# Patient Record
Sex: Male | Born: 1955 | Race: White | Hispanic: No | Marital: Married | Smoking: Never smoker
Health system: Southern US, Community
[De-identification: ages and names within clinical notes are randomized; demographics above are authoritative.]

## PROBLEM LIST (undated history)

## (undated) DIAGNOSIS — G4730 Sleep apnea, unspecified: Secondary | ICD-10-CM

## (undated) HISTORY — PX: INGUINAL HERNIA REPAIR: SUR1180

---

## 1980-08-30 HISTORY — PX: FINGER CLOSED REDUCTION: SHX1633

## 1983-08-31 HISTORY — PX: CLOSED REDUCTION / MANIPULATION JOINT: SUR207

## 1984-05-30 HISTORY — PX: BACK SURGERY: SHX140

## 1986-08-30 HISTORY — PX: HAND SURGERY: SHX662

## 2005-09-24 ENCOUNTER — Ambulatory Visit: Payer: Self-pay | Admitting: Gastroenterology

## 2009-08-30 HISTORY — PX: FOOT SURGERY: SHX648

## 2011-11-30 ENCOUNTER — Ambulatory Visit: Payer: Self-pay | Admitting: Internal Medicine

## 2013-02-26 ENCOUNTER — Ambulatory Visit: Payer: Self-pay | Admitting: Orthopedic Surgery

## 2014-06-13 ENCOUNTER — Ambulatory Visit: Payer: Self-pay | Admitting: Surgery

## 2014-08-30 HISTORY — PX: INGUINAL HERNIA REPAIR: SUR1180

## 2014-09-17 ENCOUNTER — Ambulatory Visit: Payer: Self-pay

## 2014-09-27 ENCOUNTER — Ambulatory Visit: Payer: Self-pay | Admitting: Gastroenterology

## 2014-10-15 ENCOUNTER — Ambulatory Visit: Payer: Self-pay | Admitting: Gastroenterology

## 2014-12-21 NOTE — Op Note (Signed)
PATIENT NAME:  Gregory Cooper, Gregory Cooper MR#:  [id number] DATE OF BIRTH:  03-30-1956  DATE OF PROCEDURE:  06/13/2014  PREOPERATIVE DIAGNOSIS: Umbilical hernia.   POSTOPERATIVE DIAGNOSIS: Umbilical hernia.   PROCEDURE: Umbilical hernia repair.   SURGEON: Renda RollsWilton  Quamel Fitzmaurice, MD.   ANESTHESIA: General.   INDICATIONS: This 59 year old male has had bulging at the umbilicus and associated pain. An umbilical hernia was demonstrated on physical exam and repair was recommended for definitive treatment.   DESCRIPTION OF PROCEDURE: The patient was placed on the operating table in the supine position under general anesthesia. The abdomen was prepared with ChloraPrep and draped in a sterile manner.   An incision was made just above the umbilicus oriented longitudinally approximately 3 cm in length and this was carried down to the central aspect of the umbilicus. The dissection was carried down through the subcutaneous tissues. Several small bleeding points were cauterized.  An umbilical hernia sac was encountered. This was dissected free from surrounding structures and was some 4 cm in length. It was separated from a fascial ring defect and was inverted. Next the properitoneal fat was dissected away from the fascial ring defect. The defect itself was approximately 12 mm in dimension. A Bard soft mesh was cut to create an oval shape of 1.5 x 2 cm, this was placed into the properitoneal plane, oriented transversely, and was sutured to the overlying fascia with through and through 0 Surgilon sutures. Next the repair was carried out with a transversely oriented suture line of interrupted 0 Surgilon figure-of-eight sutures incorporating each suture into the mesh. The repair looked good. Hemostasis was intact. The deep fascia and subcutaneous tissues were infiltrated with 0.5% Sensorcaine with epinephrine.  The subcutaneous tissues were closed with a 5-0 Monocryl pursestring suture. Next the skin was closed with running 5-0 Monocryl  subcuticular suture and Dermabond. The patient tolerated surgery satisfactorily and was then prepared for transfer to the recovery room.     ____________________________ Shela CommonsJ. Renda RollsWilton Bush Murdoch, MD jws:bu D: 06/13/2014 08:36:30 ET T: 06/13/2014 13:21:46 ET JOB#: [id number]  cc: Adella HareJ. Wilton Ryenne Lynam, MD, <Dictator> Adella HareWILTON J Naryah Clenney MD ELECTRONICALLY SIGNED 06/14/2014 17:52

## 2014-12-23 LAB — SURGICAL PATHOLOGY

## 2015-04-09 ENCOUNTER — Encounter
Admission: RE | Admit: 2015-04-09 | Discharge: 2015-04-09 | Disposition: A | Discharge status: Home / Self Care | Payer: BC Managed Care – PPO | Source: Ambulatory Visit | Attending: Orthopedic Surgery | Admitting: Orthopedic Surgery

## 2015-04-09 DIAGNOSIS — Z01812 Encounter for preprocedural laboratory examination: Secondary | ICD-10-CM | POA: Diagnosis present

## 2015-04-09 HISTORY — DX: Sleep apnea, unspecified: G47.30

## 2015-04-09 LAB — CBC
HCT: 38.7 % — ABNORMAL LOW (ref 40.0–52.0)
Hemoglobin: 12.2 g/dL — ABNORMAL LOW (ref 13.0–18.0)
MCH: 20.2 pg — AB (ref 26.0–34.0)
MCHC: 31.6 g/dL — AB (ref 32.0–36.0)
MCV: 63.8 fL — AB (ref 80.0–100.0)
PLATELETS: 164 10*3/uL (ref 150–440)
RBC: 6.06 MIL/uL — ABNORMAL HIGH (ref 4.40–5.90)
RDW: 16.8 % — ABNORMAL HIGH (ref 11.5–14.5)
WBC: 6.9 10*3/uL (ref 3.8–10.6)

## 2015-04-09 LAB — BASIC METABOLIC PANEL
ANION GAP: 9 (ref 5–15)
BUN: 11 mg/dL (ref 6–20)
CO2: 27 mmol/L (ref 22–32)
Calcium: 9.1 mg/dL (ref 8.9–10.3)
Chloride: 103 mmol/L (ref 101–111)
Creatinine, Ser: 0.86 mg/dL (ref 0.61–1.24)
Glucose, Bld: 108 mg/dL — ABNORMAL HIGH (ref 65–99)
Potassium: 4.1 mmol/L (ref 3.5–5.1)
SODIUM: 139 mmol/L (ref 135–145)

## 2015-04-09 LAB — URINALYSIS COMPLETE WITH MICROSCOPIC (ARMC ONLY)
BILIRUBIN URINE: NEGATIVE
Glucose, UA: NEGATIVE mg/dL
Hgb urine dipstick: NEGATIVE
KETONES UR: NEGATIVE mg/dL
Leukocytes, UA: NEGATIVE
Nitrite: NEGATIVE
Protein, ur: NEGATIVE mg/dL
RBC / HPF: NONE SEEN RBC/hpf (ref 0–5)
Specific Gravity, Urine: 1.021 (ref 1.005–1.030)
Squamous Epithelial / LPF: NONE SEEN
pH: 5 (ref 5.0–8.0)

## 2015-04-09 LAB — ABO/RH: ABO/RH(D): O POS

## 2015-04-09 LAB — SURGICAL PCR SCREEN
MRSA, PCR: NEGATIVE
STAPHYLOCOCCUS AUREUS: POSITIVE — AB

## 2015-04-09 LAB — TYPE AND SCREEN
ABO/RH(D): O POS
Antibody Screen: NEGATIVE

## 2015-04-09 LAB — PROTIME-INR
INR: 1.06
PROTHROMBIN TIME: 14 s (ref 11.4–15.0)

## 2015-04-09 LAB — SEDIMENTATION RATE: SED RATE: 4 mm/h (ref 0–20)

## 2015-04-09 LAB — APTT: aPTT: 26 seconds (ref 24–36)

## 2015-04-09 NOTE — Patient Instructions (Addendum)
  Your procedure is scheduled on:Wednesday April 23, 2015 Report to Same Day Surgery. To find out your arrival time please call [phone] between 1PM - 3PM on Tuesday April 22, 2015.  Remember: Instructions that are not followed completely may result in serious medical risk, up to and including death, or upon the discretion of your surgeon and anesthesiologist your surgery may need to be rescheduled.    __x__ 1. Do not eat food or drink liquids after midnight. No gum chewing or hard candies.     ____ 2. No Alcohol for 24 hours before or after surgery.   ____ 3. Bring all medications with you on the day of surgery if instructed.    __x__ 4. Notify your doctor if there is any change in your medical condition     (cold, fever, infections).     Do not wear jewelry, make-up, hairpins, clips or nail polish.  Do not wear lotions, powders, or perfumes. You may wear deodorant.  Do not shave 48 hours prior to surgery. Men may shave face and neck.  Do not bring valuables to the hospital.    [facility] is not responsible for any belongings or valuables.               Contacts, dentures or bridgework may not be worn into surgery.  Leave your suitcase in the car. After surgery it may be brought to your room.  For patients admitted to the hospital, discharge time is determined by your treatment team.   Patients discharged the day of surgery will not be allowed to drive home.    Please read over the following fact sheets that you were given:   [facility] Preparing for Surgery  ____ Take these medicines the morning of surgery with A SIP OF WATER: NONE    ____ Fleet Enema (as directed)   __x__ Use CHG Soap as directed  ____ Use inhalers on the day of surgery  ____ Stop metformin 2 days prior to surgery    ____ Take 1/2 of usual insulin dose the night before surgery and none on the morning of surgery.   _x___ Stop aspirin on April 16, 2015  __x__ Stop Anti-inflammatories(Aleve,  ibuprofen etc) on April 16, 2015. OK to take Tylenol.   ____ Stop supplements until after surgery.    ____ Bring C-Pap to the hospital.

## 2015-04-11 LAB — URINE CULTURE: Culture: NO GROWTH

## 2015-04-23 ENCOUNTER — Encounter: Admission: RE | Payer: Self-pay | Source: Ambulatory Visit

## 2015-04-23 ENCOUNTER — Inpatient Hospital Stay
Admission: RE | Admit: 2015-04-23 | Payer: BC Managed Care – PPO | Source: Ambulatory Visit | Admitting: Orthopedic Surgery

## 2015-04-23 SURGERY — ARTHROPLASTY, KNEE, TOTAL
Anesthesia: Choice | Laterality: Left

## 2015-05-20 ENCOUNTER — Encounter: Payer: Self-pay | Admitting: *Deleted

## 2015-05-20 ENCOUNTER — Inpatient Hospital Stay: Admission: RE | Admit: 2015-05-20 | Payer: BC Managed Care – PPO | Source: Ambulatory Visit

## 2015-05-20 NOTE — Pre-Procedure Instructions (Signed)
CALLED ELAINE AT DR HOOTENS OFFICE ABOUT PTS ORDERS-PT WAS CANCELLED IN AUGUST DUE TO [employer] AND THEY RESCHEDULED PT TO BE A PHONE INTERVIEW TO REVIEW MEDS WITH PT-THE ORDERS THAT WERE SENT OVER WERE THE OLD ORDERS THAT HAD ALL OF HIS PREVIOUS LABS THAT WERE ALL DRAWN IN AUGUST-PT IS COMING IN ON Thursday FOR T&S AND WAS WANDERING WHAT LABS IF ANY DR HOOTEN WANTED SINCE ORDERS WERE THE SAME AS BEFORE. ELAINE SAID SHE WOULD CHECK WITH DR Ernest Pine AND LET ME KNOW

## 2015-05-20 NOTE — Patient Instructions (Signed)
  Your procedure is scheduled on: 05-26-15 Report to [facility] 2ND FLOOR To find out your arrival time please call [phone] between 1PM - 3PM on 05-23-15 (FRIDAY)  Remember: Instructions that are not followed completely may result in serious medical risk, up to and including death, or upon the discretion of your surgeon and anesthesiologist your surgery may need to be rescheduled.    __X__ 1. Do not eat food or drink liquids after midnight. No gum chewing or hard candies.     __X__ 2. No Alcohol for 24 hours before or after surgery.   ____ 3. Bring all medications with you on the day of surgery if instructed.    __X__ 4. Notify your doctor if there is any change in your medical condition     (cold, fever, infections).     Do not wear jewelry, make-up, hairpins, clips or nail polish.  Do not wear lotions, powders, or perfumes. You may wear deodorant.  Do not shave 48 hours prior to surgery. Men may shave face and neck.  Do not bring valuables to the hospital.    [facility] is not responsible for any belongings or valuables.               Contacts, dentures or bridgework may not be worn into surgery.  Leave your suitcase in the car. After surgery it may be brought to your room.  For patients admitted to the hospital, discharge time is determined by your  treatment team.   Patients discharged the day of surgery will not be allowed to drive home.   Please read over the following fact sheets that you were given:      ____ Take these medicines the morning of surgery with A SIP OF WATER:    1. NONE  2.   3.   4.  5.  6.  ____ Fleet Enema (as directed)   __X__ Use CHG Soap as directed  ____ Use inhalers on the day of surgery  ____ Stop metformin 2 days prior to surgery    ____ Take 1/2 of usual insulin dose the night before surgery and none on the morning of surgery.   ____ Stop Coumadin/Plavix/aspirin-N/A  ____ Stop Anti-inflammatories-NO NSAIDS OR  ASPIRIN PRODUCTS-TYLENOL OK   ____ Stop supplements until after surgery.    __X__ Bring C-Pap to the hospital.

## 2015-05-22 ENCOUNTER — Encounter
Admission: RE | Admit: 2015-05-22 | Discharge: 2015-05-22 | Disposition: A | Discharge status: Home / Self Care | Payer: BC Managed Care – PPO | Source: Ambulatory Visit | Attending: Orthopedic Surgery | Admitting: Orthopedic Surgery

## 2015-05-22 DIAGNOSIS — Z01812 Encounter for preprocedural laboratory examination: Secondary | ICD-10-CM | POA: Insufficient documentation

## 2015-05-22 LAB — TYPE AND SCREEN
ABO/RH(D): O POS
ANTIBODY SCREEN: NEGATIVE

## 2015-05-22 LAB — SURGICAL PCR SCREEN
MRSA, PCR: NEGATIVE
STAPHYLOCOCCUS AUREUS: NEGATIVE

## 2015-05-22 NOTE — Pre-Procedure Instructions (Signed)
ELAINE FROM DR HOOTENS OFFICE CALLED BACK AND LEFT ME A MESSAGE THAT STATED THAT DR HOOTEN ONLY WANTS T&S DONE-NO OTHER LABS NEED TO BE REPEATED PER ELAINE PRIOR TO RESCHEDULED TOTAL KNEE ARTHROPLASTY

## 2015-05-26 ENCOUNTER — Encounter: Admission: RE | Payer: Self-pay | Source: Ambulatory Visit

## 2015-05-26 ENCOUNTER — Inpatient Hospital Stay
Admission: RE | Admit: 2015-05-26 | Payer: BC Managed Care – PPO | Source: Ambulatory Visit | Admitting: Orthopedic Surgery

## 2015-05-26 SURGERY — ARTHROPLASTY, KNEE, TOTAL
Anesthesia: Choice | Laterality: Left

## 2015-05-26 MED ORDER — FAMOTIDINE 20 MG PO TABS: 20.0000 mg | ORAL_TABLET | Freq: Once | ORAL | Status: DC

## 2015-05-26 MED ORDER — TRANEXAMIC ACID 1000 MG/10ML IV SOLN
1500.0000 mg | INTRAVENOUS | Status: DC
  Filled 2015-05-26: qty 15

## 2015-05-26 MED ORDER — LACTATED RINGERS IV SOLN: INTRAVENOUS | Status: DC

## 2015-05-26 MED ORDER — CLINDAMYCIN PHOSPHATE 900 MG/50ML IV SOLN: 900.0000 mg | Freq: Once | INTRAVENOUS | Status: DC

## 2015-05-26 NOTE — OR Nursing (Deleted)
Vascular lab and [facility] notified pt has not arrived yet.

## 2015-07-10 ENCOUNTER — Encounter
Admission: RE | Admit: 2015-07-10 | Discharge: 2015-07-10 | Disposition: A | Discharge status: Home / Self Care | Payer: BC Managed Care – PPO | Source: Ambulatory Visit | Attending: Orthopedic Surgery | Admitting: Orthopedic Surgery

## 2015-07-10 DIAGNOSIS — Z01812 Encounter for preprocedural laboratory examination: Secondary | ICD-10-CM | POA: Diagnosis present

## 2015-07-10 LAB — PROTIME-INR
INR: 1.06
Prothrombin Time: 14 seconds (ref 11.4–15.0)

## 2015-07-10 LAB — CBC
HCT: 38.7 % — ABNORMAL LOW (ref 40.0–52.0)
Hemoglobin: 11.9 g/dL — ABNORMAL LOW (ref 13.0–18.0)
MCH: 19.6 pg — ABNORMAL LOW (ref 26.0–34.0)
MCHC: 30.8 g/dL — ABNORMAL LOW (ref 32.0–36.0)
MCV: 63.7 fL — ABNORMAL LOW (ref 80.0–100.0)
PLATELETS: 174 10*3/uL (ref 150–440)
RBC: 6.08 MIL/uL — AB (ref 4.40–5.90)
RDW: 16.7 % — AB (ref 11.5–14.5)
WBC: 6.1 10*3/uL (ref 3.8–10.6)

## 2015-07-10 LAB — SURGICAL PCR SCREEN
MRSA, PCR: NEGATIVE
STAPHYLOCOCCUS AUREUS: NEGATIVE

## 2015-07-10 LAB — URINALYSIS COMPLETE WITH MICROSCOPIC (ARMC ONLY)
Bacteria, UA: NONE SEEN
Bilirubin Urine: NEGATIVE
GLUCOSE, UA: NEGATIVE mg/dL
Hgb urine dipstick: NEGATIVE
Ketones, ur: NEGATIVE mg/dL
LEUKOCYTES UA: NEGATIVE
Nitrite: NEGATIVE
Protein, ur: NEGATIVE mg/dL
RBC / HPF: NONE SEEN RBC/hpf (ref 0–5)
SPECIFIC GRAVITY, URINE: 1.027 (ref 1.005–1.030)
SQUAMOUS EPITHELIAL / LPF: NONE SEEN
pH: 5 (ref 5.0–8.0)

## 2015-07-10 LAB — BASIC METABOLIC PANEL
ANION GAP: 6 (ref 5–15)
BUN: 10 mg/dL (ref 6–20)
CALCIUM: 8.6 mg/dL — AB (ref 8.9–10.3)
CO2: 25 mmol/L (ref 22–32)
Chloride: 108 mmol/L (ref 101–111)
Creatinine, Ser: 0.96 mg/dL (ref 0.61–1.24)
GFR calc Af Amer: >60 mL/min (ref >60)
GFR calc non Af Amer: >60 mL/min (ref >60)
Glucose, Bld: 109 mg/dL — ABNORMAL HIGH (ref 65–99)
POTASSIUM: 4 mmol/L (ref 3.5–5.1)
Sodium: 139 mmol/L (ref 135–145)

## 2015-07-10 LAB — TYPE AND SCREEN
ABO/RH(D): O POS
ANTIBODY SCREEN: NEGATIVE

## 2015-07-10 LAB — APTT: APTT: 28 s (ref 24–36)

## 2015-07-10 LAB — SEDIMENTATION RATE: SED RATE: 5 mm/h (ref 0–20)

## 2015-07-10 NOTE — Patient Instructions (Signed)
  Your procedure is scheduled on: Wednesday Nov. 23, 2016. Report to Same Day Surgery. To find out your arrival time please call [phone] between 1PM - 3PM on Tuesday Nov. 22, 2016 .  Remember: Instructions that are not followed completely may result in serious medical risk, up to and including death, or upon the discretion of your surgeon and anesthesiologist your surgery may need to be rescheduled.    _x___ 1. Do not eat food or drink liquids after midnight. No gum chewing or hard candies.     _x__ 2. No Alcohol for 24 hours before or after surgery.   ____ 3. Bring all medications with you on the day of surgery if instructed.    __x__ 4. Notify your doctor if there is any change in your medical condition     (cold, fever, infections).     Do not wear jewelry, make-up, hairpins, clips or nail polish.  Do not wear lotions, powders, or perfumes. You may wear deodorant.  Do not shave 48 hours prior to surgery. Men may shave face and neck.  Do not bring valuables to the hospital.    [facility] or [facility]uables.               Contacts, dentures or bridgework may not be worn into surgery.  Leave your suitcase in the car. After surgery it may be brought to your room.  For patients admitted to the hospital, discharge time is determined by your treatment team.   Patients discharged the day of surgery will not be allowed to drive home.    Please read over the following fact sheets that you were given:   [facility]  ____ Take these medicines the morning of surgery with A SIP OF WATER: NONE    ____ Fleet Enema (as directed)   __x__ Use CHG Soap as directed  ____ Use inhalers on the day of surgery  ____ Stop metformin 2 days prior to surgery    ____ Take 1/2 of usual insulin dose the night before surgery and none on the morning of surgery.   _x___ Stop Aspirin on 7 days prior to surgery.  ____ Stop Anti-inflammatories on  7 days prior to surgery.  See Dr. Elenor LegatoHooten's list of medications to stop before surgery.   ____ Stop supplements until after surgery.    ____ Bring C-Pap to the hospital.

## 2015-07-12 LAB — URINE CULTURE
CULTURE: NO GROWTH
SPECIAL REQUESTS: NORMAL

## 2015-07-23 ENCOUNTER — Encounter: Payer: Self-pay | Admitting: Certified Registered Nurse Anesthetist

## 2015-07-23 ENCOUNTER — Inpatient Hospital Stay
Admission: RE | Admit: 2015-07-23 | Payer: BC Managed Care – PPO | Source: Ambulatory Visit | Admitting: Orthopedic Surgery

## 2015-07-23 ENCOUNTER — Encounter: Admission: RE | Payer: Self-pay | Source: Ambulatory Visit

## 2015-07-23 SURGERY — ARTHROPLASTY, KNEE, TOTAL, USING IMAGELESS COMPUTER-ASSISTED NAVIGATION
Anesthesia: Choice | Laterality: Left

## 2015-07-23 MED ORDER — ACETAMINOPHEN 10 MG/ML IV SOLN
INTRAVENOUS | Status: AC
  Filled 2015-07-23: qty 100

## 2015-07-23 MED ORDER — BUPIVACAINE LIPOSOME 1.3 % IJ SUSP
INTRAMUSCULAR | Status: AC
  Filled 2015-07-23: qty 20

## 2015-07-23 MED ORDER — BUPIVACAINE-EPINEPHRINE (PF) 0.25% -1:200000 IJ SOLN
INTRAMUSCULAR | Status: AC
  Filled 2015-07-23: qty 30

## 2015-07-23 MED ORDER — FAMOTIDINE 20 MG PO TABS: 20.0000 mg | ORAL_TABLET | Freq: Once | ORAL | Status: DC

## 2015-07-23 MED ORDER — CLINDAMYCIN PHOSPHATE 900 MG/50ML IV SOLN: 900.0000 mg | Freq: Once | INTRAVENOUS | Status: DC

## 2015-07-23 MED ORDER — TRANEXAMIC ACID 1000 MG/10ML IV SOLN
1500.0000 mg | INTRAVENOUS | Status: DC
  Filled 2015-07-23: qty 15

## 2015-07-23 MED ORDER — SODIUM CHLORIDE 0.9 % IJ SOLN
INTRAMUSCULAR | Status: AC
  Filled 2015-07-23: qty 50

## 2015-07-23 MED ORDER — NEOMYCIN-POLYMYXIN B GU 40-200000 IR SOLN
Status: AC
  Filled 2015-07-23: qty 14

## 2015-07-23 MED ORDER — LACTATED RINGERS IV SOLN: INTRAVENOUS | Status: DC

## 2016-12-25 IMAGING — CT CT ABD-PELV W/ CM
2 of 5 series · 16 of 46 positions shown, 18 images · IV contrast (omnipaque)
Comparison: None.

CLINICAL DATA: Left flank pain for 1 month. History of hernia
repair with mesh.

EXAM:
CT ABDOMEN AND PELVIS WITH CONTRAST
TECHNIQUE: Multidetector CT imaging of the abdomen and pelvis was performed
using the standard protocol following bolus administration of
intravenous contrast.
CONTRAST:  100 mL Omnipaque

[Series 2: routine abd pel with · axial · 0.85mm/px · z∈[-1130,-704]mm · 13 of 97 slices shown, 15 images]
[im 6/97  soft-tissue]
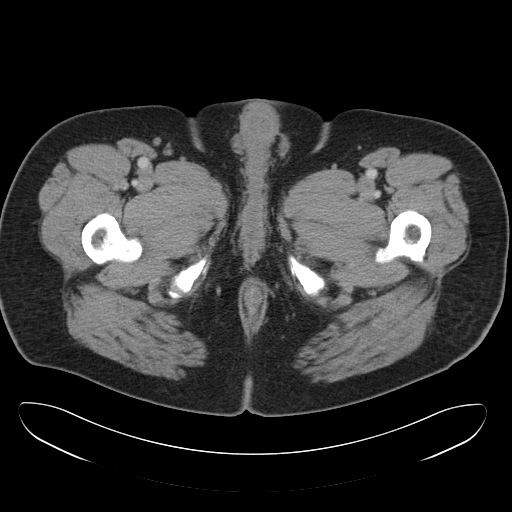
[im 6/97  bone]
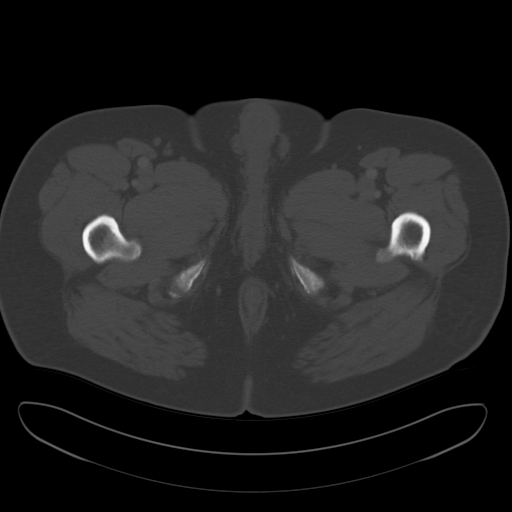
[im 16/97  soft-tissue]
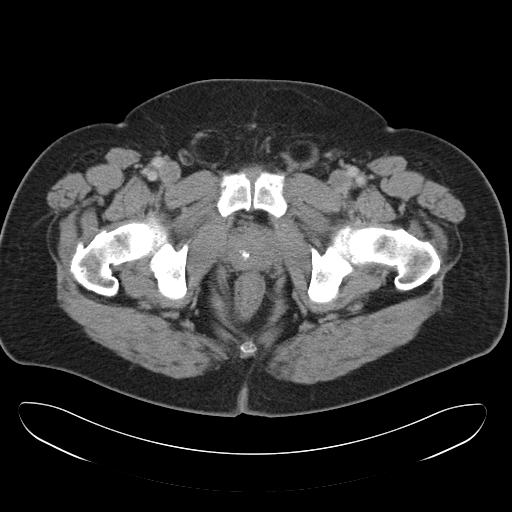
[im 21/97  soft-tissue]
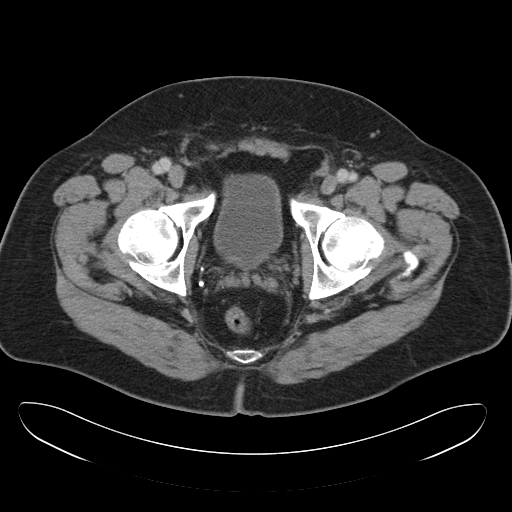
[im 26/97  soft-tissue]
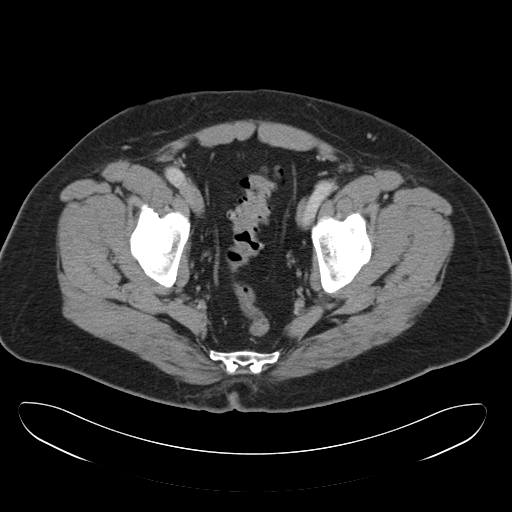
[im 36/97  soft-tissue]
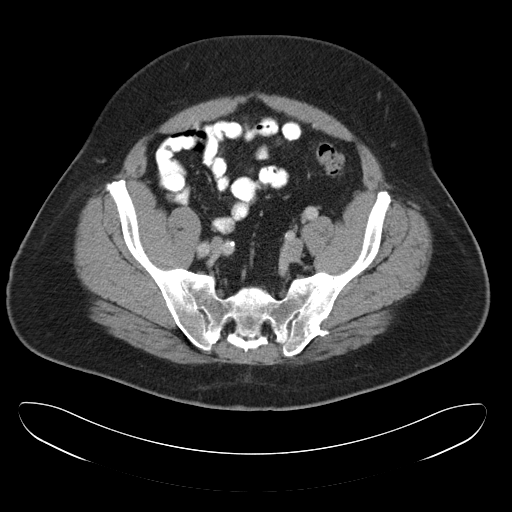
[im 41/97  soft-tissue]
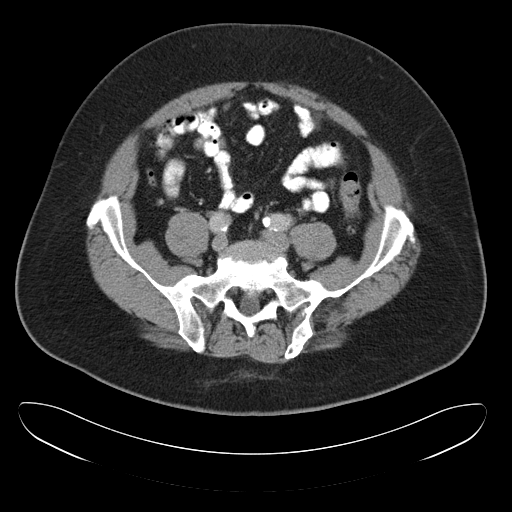
[im 51/97  soft-tissue]
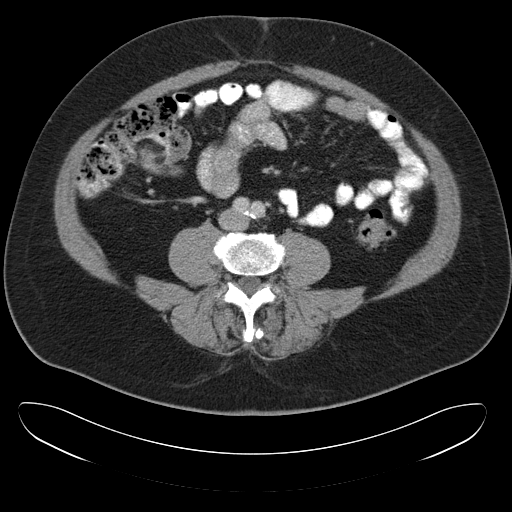
[im 56/97  soft-tissue]
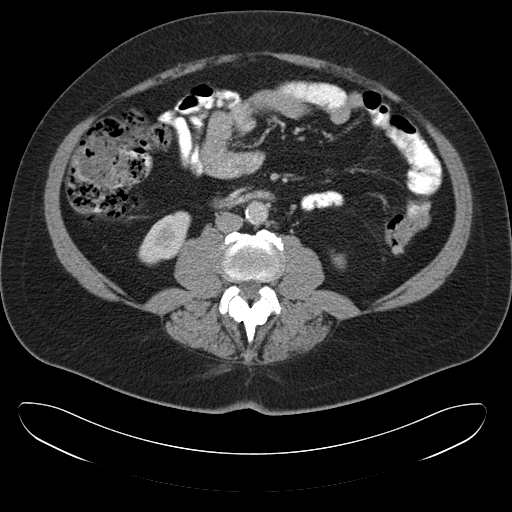
[im 61/97  soft-tissue]
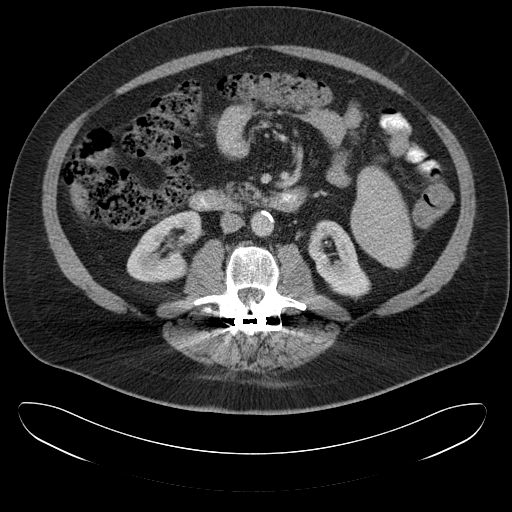
[im 61/97  bone]
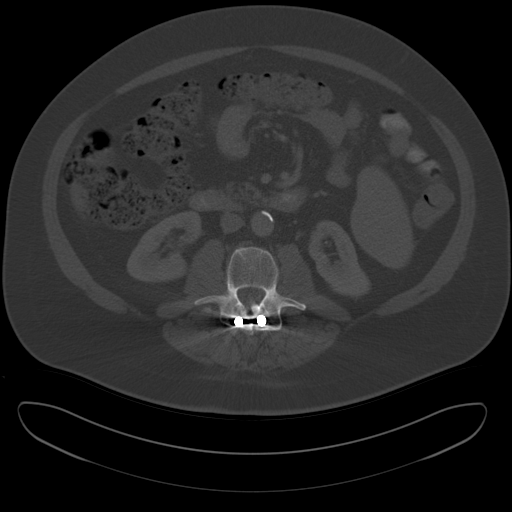
[im 71/97  soft-tissue]
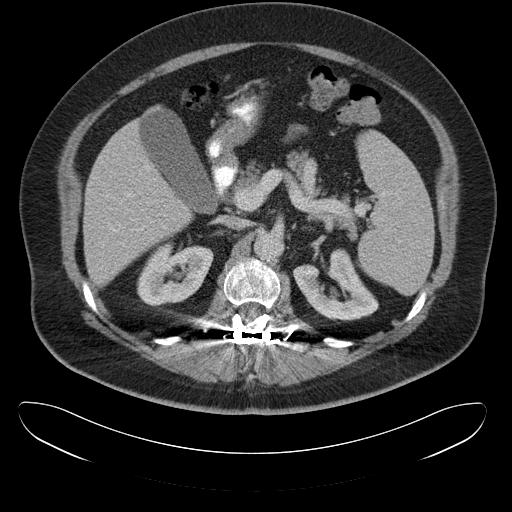
[im 76/97  soft-tissue]
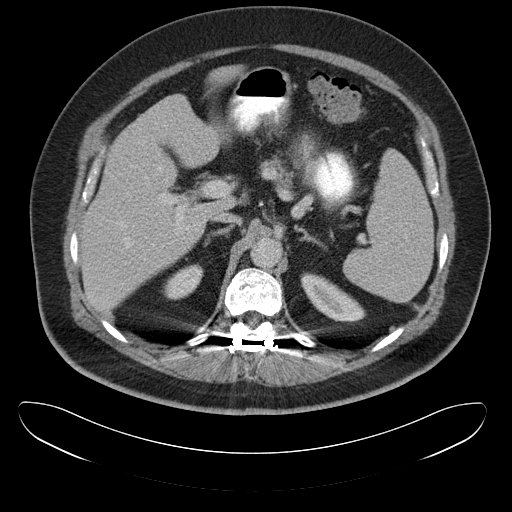
[im 81/97  soft-tissue]
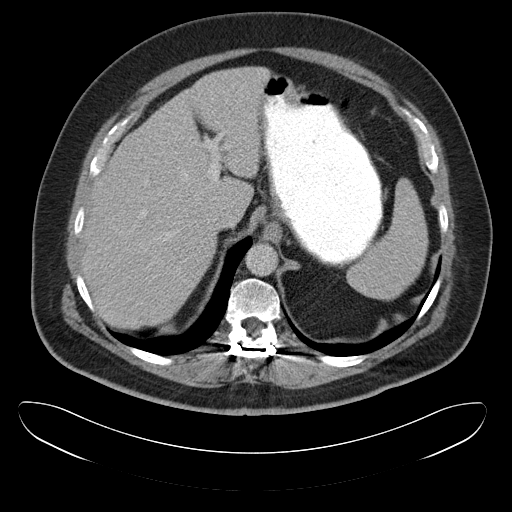
[im 91/97  soft-tissue]
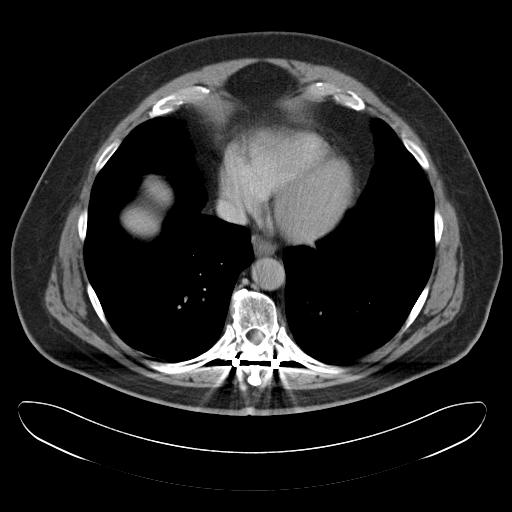

[Series 6: cor routine abd pel with · coronal · 0.84mm/px · 3 of 160 slices shown]
[im 54/160  soft-tissue]
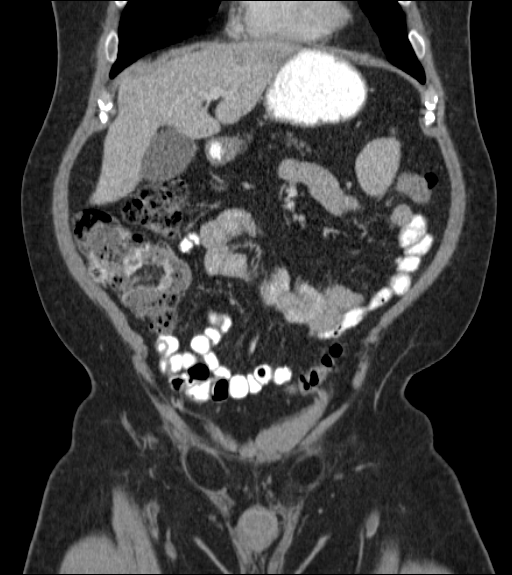
[im 71/160  soft-tissue]
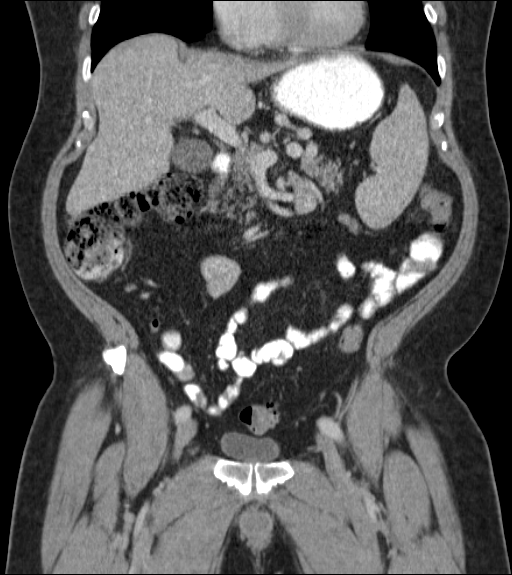
[im 89/160  soft-tissue]
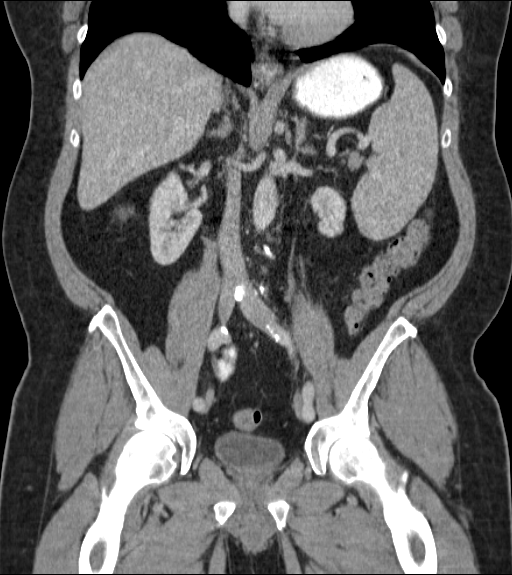

[16 of 46 positions shown; findings below may reference images not displayed]

FINDINGS: Lower chest:  Lung bases are clear.

Hepatobiliary: No focal hepatic lesion. No biliary duct dilatation.
Gallbladder is normal. Common bile duct is normal.

Pancreas: Pancreas is normal. No ductal dilatation. No pancreatic
inflammation.

Spleen: Normal spleen.

Adrenals/urinary tract: Adrenal glands are normal. Kidneys, ureters,
bladder normal. Nonobstructing calculus lower pole of the left
kidney.

Stomach/Bowel: Stomach, small bowel, appendix, cecum are normal. The
colon has diverticula through the descending colon sigmoid colon
without acute inflammation. The rectum is normal. There is a short
small bowel intussusceptions in the left lower quadrant measuring 3
cm (image 52, series 2). No mass lesion evident. No inflammation. No
obstruction.

Vascular/Lymphatic: Abdominal aorta is normal caliber. There is no
retroperitoneal or periportal lymphadenopathy. No pelvic
lymphadenopathy.

Reproductive: Prostate gland is normal. No pelvic lymphadenopathy.
There are bilateral fat filled inguinal hernias without
inflammation.

Musculoskeletal: Posterior spinal fusion noted. No aggressive
osseous lesion.

Other: No free fluid the abdomen or pelvis.
IMPRESSION: 1. Sigmoid diverticulosis without evidence of diverticulitis.
2. Bilateral fat filled inguinal hernias.
3. Short segment small bowel intussusception. Intussusceptions of
this length (less than 3.5 cm) are typically self-limiting.
Recommend follow-up exam if patient has persistent left lower
quadrant pain.

## 2017-01-04 IMAGING — CR DG SMALL BOWEL
1 series · 4 of 4 positions shown · non-contrast
Comparison: None.

CLINICAL DATA: Abdominal pain.

EXAM:
SMALL BOWEL SERIES
TECHNIQUE: Following ingestion of thin barium, serial small bowel images were
obtained including spot views of the terminal ileum.
FLUOROSCOPY TIME:  0 min 30 seconds.

[Series 1: scout · 0.17mm/px · 4 of 4 slices shown]
[im 1/4]
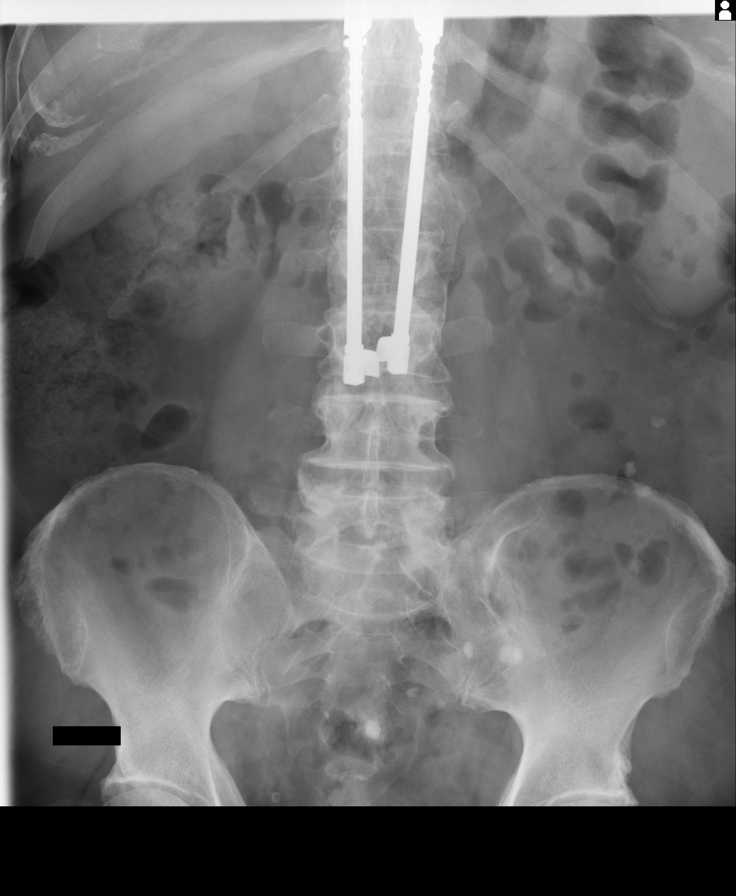
[im 2/4]
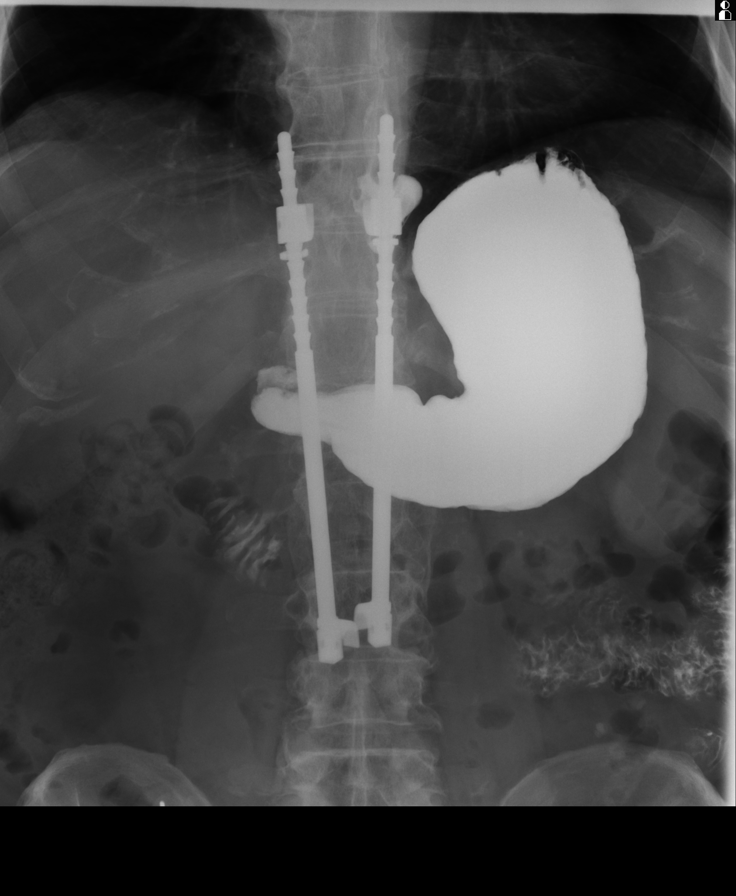
[im 3/4]
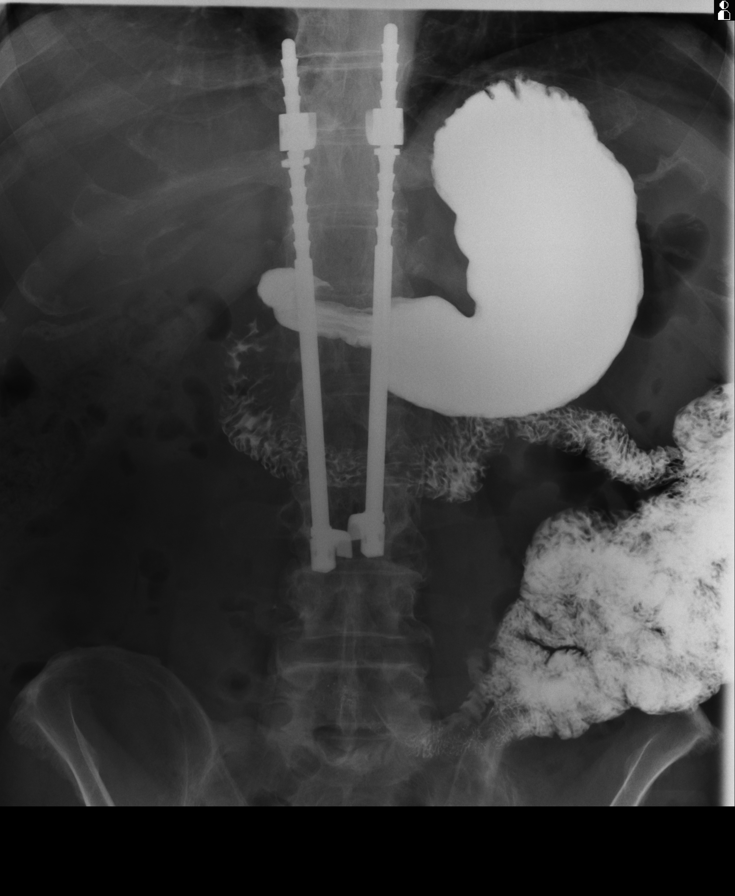
[im 4/4]
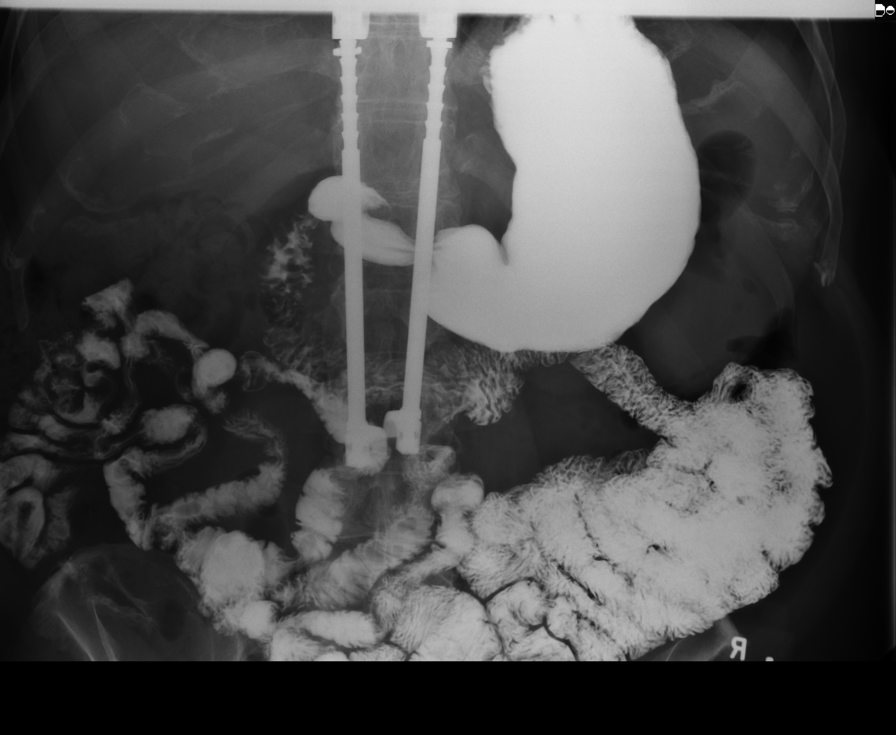

[4 of 4 positions shown; findings below may reference images not displayed]

FINDINGS: Small bowel caliber and fold thickness is normal. No focal lesions
identified. The terminal ileum is normal. Appendix visualized.
IMPRESSION: Normal exam.
